# Patient Record
Sex: Female | Born: 1980
Health system: Southern US, Community
[De-identification: ages and names within clinical notes are randomized; demographics above are authoritative.]

---

## 2021-01-08 ENCOUNTER — Other Ambulatory Visit: Payer: Self-pay | Admitting: Physician Assistant

## 2021-01-08 DIAGNOSIS — R928 Other abnormal and inconclusive findings on diagnostic imaging of breast: Secondary | ICD-10-CM

## 2021-01-20 ENCOUNTER — Ambulatory Visit
Admission: RE | Admit: 2021-01-20 | Discharge: 2021-01-20 | Disposition: A | Discharge status: Home / Self Care | Payer: BLUE CROSS/BLUE SHIELD | Source: Ambulatory Visit | Attending: Physician Assistant | Admitting: Physician Assistant

## 2021-01-20 ENCOUNTER — Other Ambulatory Visit: Payer: Self-pay

## 2021-01-20 DIAGNOSIS — R928 Other abnormal and inconclusive findings on diagnostic imaging of breast: Secondary | ICD-10-CM

## 2021-01-21 ENCOUNTER — Other Ambulatory Visit: Payer: Self-pay | Admitting: Physician Assistant

## 2021-01-22 ENCOUNTER — Other Ambulatory Visit: Payer: Self-pay | Admitting: Physician Assistant

## 2021-01-22 DIAGNOSIS — R928 Other abnormal and inconclusive findings on diagnostic imaging of breast: Secondary | ICD-10-CM

## 2021-01-24 ENCOUNTER — Other Ambulatory Visit: Payer: Self-pay | Admitting: Family Medicine

## 2021-01-24 DIAGNOSIS — N649 Disorder of breast, unspecified: Secondary | ICD-10-CM

## 2021-01-29 ENCOUNTER — Other Ambulatory Visit: Payer: Self-pay | Admitting: Family Medicine

## 2021-01-29 DIAGNOSIS — N649 Disorder of breast, unspecified: Secondary | ICD-10-CM

## 2021-02-11 ENCOUNTER — Ambulatory Visit: Payer: BLUE CROSS/BLUE SHIELD | Admitting: General Surgery

## 2021-02-11 ENCOUNTER — Other Ambulatory Visit: Payer: Self-pay

## 2021-02-11 ENCOUNTER — Ambulatory Visit: Payer: Commercial Managed Care - PPO | Admitting: General Surgery

## 2021-02-11 ENCOUNTER — Encounter: Payer: Self-pay | Admitting: General Surgery

## 2021-02-11 VITALS — BP 102/69 | HR 74 | Temp 97.9°F | Resp 12 | Ht 64.0 in | Wt 115.0 lb

## 2021-02-11 DIAGNOSIS — R920 Mammographic microcalcification found on diagnostic imaging of breast: Secondary | ICD-10-CM | POA: Diagnosis not present

## 2021-02-12 NOTE — Progress Notes (Signed)
[place]; [id number]; 09-May-1981   HPI Patient is a 40 year old black female who was referred to my care by Dr. Leandrew Koyanagi for evaluation and treatment of a left breast lesion.  She was found on recent core biopsy of microcalcifications in the left breast to have epithelial atypia.  It was also recommended to undergo a stereotactic core biopsy of the upper, inner quadrant of the right breast.  Patient does not feel a lump.  She has not had nipple discharge.  Her mother was recently diagnosed at the age of 67 with breast cancer. History reviewed. No pertinent past medical history.  History reviewed. No pertinent surgical history.  History reviewed. No pertinent family history.  No current outpatient medications on file prior to visit.   No current facility-administered medications on file prior to visit.    Allergies  Allergen Reactions  . Iodine Anaphylaxis    Social History   Substance and Sexual Activity  Alcohol Use Not Currently   Comment: Occasional    Social History   Tobacco Use  Smoking Status Former Smoker  Smokeless Tobacco Never Used    Review of Systems  Constitutional: Negative.   HENT: Negative.   Eyes: Negative.   Respiratory: Negative.   Cardiovascular: Negative.   Gastrointestinal: Negative.   Genitourinary: Negative.   Musculoskeletal: Negative.   Skin: Negative.   Neurological: Negative.   Endo/Heme/Allergies: Negative.   Psychiatric/Behavioral: Negative.     Objective   Vitals:   02/11/21 1415  BP: 102/69  Pulse: 74  Resp: 12  Temp: 97.9 F (36.6 C)  SpO2: 98%    Physical Exam Vitals reviewed.  Constitutional:      Appearance: Normal appearance. She is normal weight. She is not ill-appearing.  HENT:     Head: Normocephalic and atraumatic.  Cardiovascular:     Rate and Rhythm: Normal rate and regular rhythm.     Heart sounds: Normal heart sounds. No murmur heard. No friction rub. No gallop.   Pulmonary:     Effort:  Pulmonary effort is normal. No respiratory distress.     Breath sounds: Normal breath sounds. No stridor. No wheezing, rhonchi or rales.  Musculoskeletal:     Cervical back: Normal range of motion and neck supple.  Lymphadenopathy:     Cervical: No cervical adenopathy.  Skin:    General: Skin is warm and dry.  Neurological:     Mental Status: She is alert and oriented to person, place, and time.   Breast: No dominant mass, nipple discharge, or dimpling in either breast noted.  Axilla is negative for palpable nodes.  Mammography and pathology reports reviewed  Assessment  Epithelial atypia microcalcifications in left breast Microcalcifications in the right breast Immediate family history of breast cancer Plan   I told the patient that we need to get the stereotactic biopsy of the right breast prior to proceeding with breast biopsy.  [facility]  In addition, I would like to have radiofrequency tag is placed in both lesions.  We will then proceed with left breast biopsy with possible right breast biopsy after this is done.

## 2021-03-25 ENCOUNTER — Telehealth: Payer: Self-pay | Admitting: Family Medicine

## 2021-03-25 NOTE — Telephone Encounter (Signed)
Following up on recommended repeated mammo and biopsy.   Imaging notes - 02/14/21: LM FOR PT TO RESCHEDULE-AJ  02/11/21-2ND ATTEMPT/PT HAVING 2ND OPINION/WCB IF DECIDES TO SCHEDULE-SB 01/22/2021: LEFT MESSAGE FOR PATIENT TO CALL BACK AND SCHEDULE APPOINTMENT - PER DOCTOR BROWN/PROFICIENT/MLG  Called pt and LMOVMTRC.

## 2022-02-15 IMAGING — MG MM BREAST BX W LOC DEV 1ST LESION IMAGE BX SPEC STEREO GUIDE*L*
6 of 12 series · 6 of 40 positions shown · non-contrast
Comparison: Previous exams.
COMPARISON: Previous exams.

Addendum:
CLINICAL DATA: Patient presents for stereotactic guided core biopsy
of LEFT breast calcifications.

EXAM:
LEFT BREAST STEREOTACTIC CORE NEEDLE BIOPSY

[L (1 of 3)]
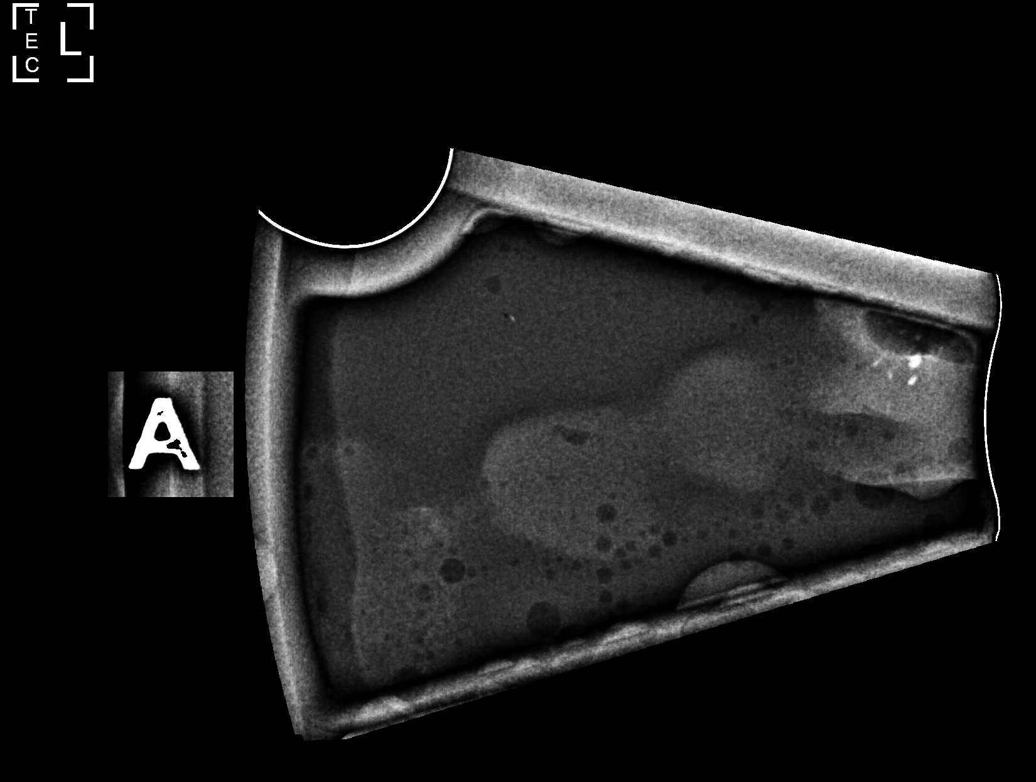

[L (2 of 3)]
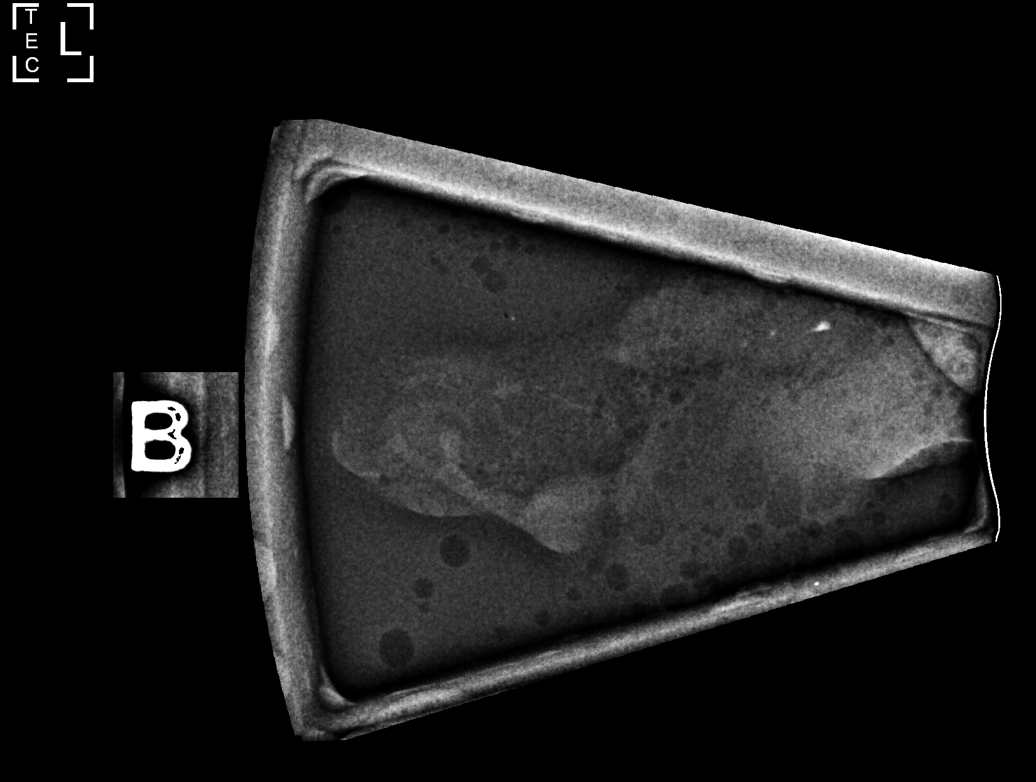

[L (3 of 3)]
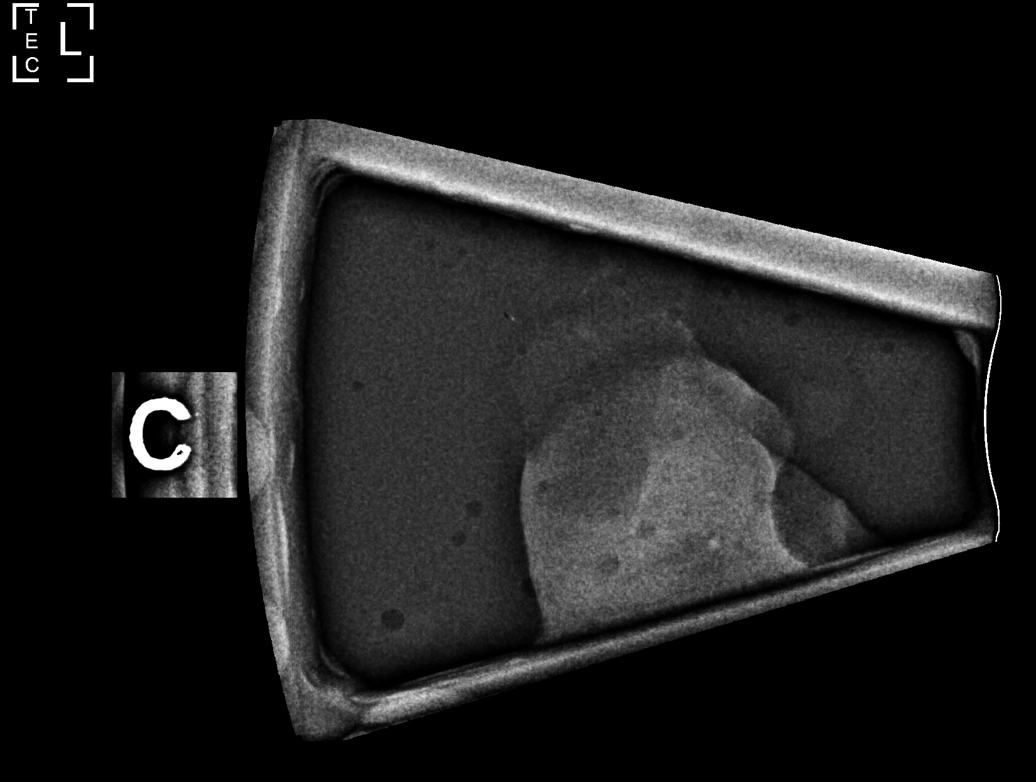

[L LM (1 of 2)]
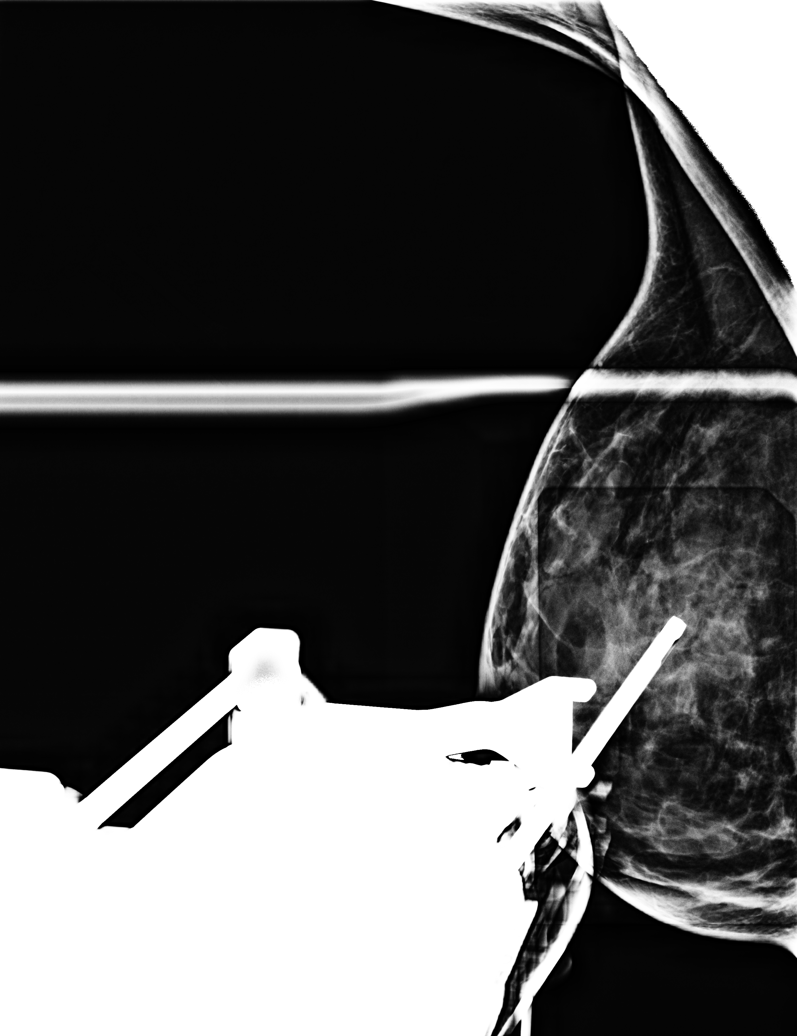

[L LM (2 of 2)]
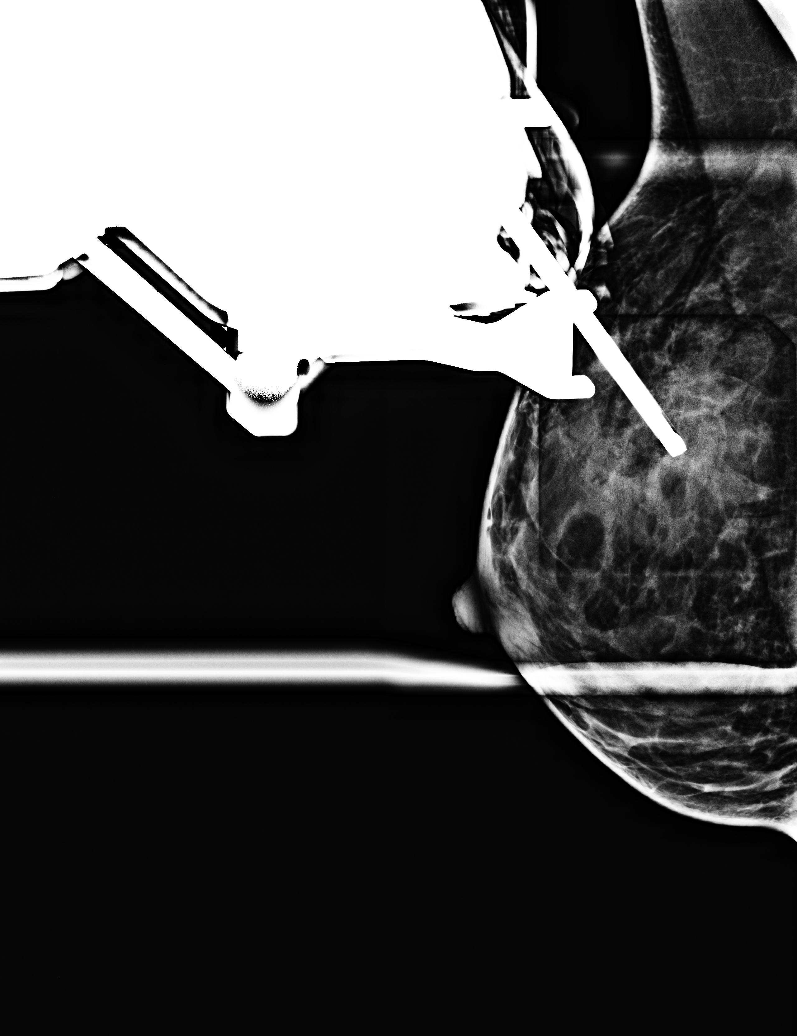

[L CC tomo · tomo slice 21/41.0]
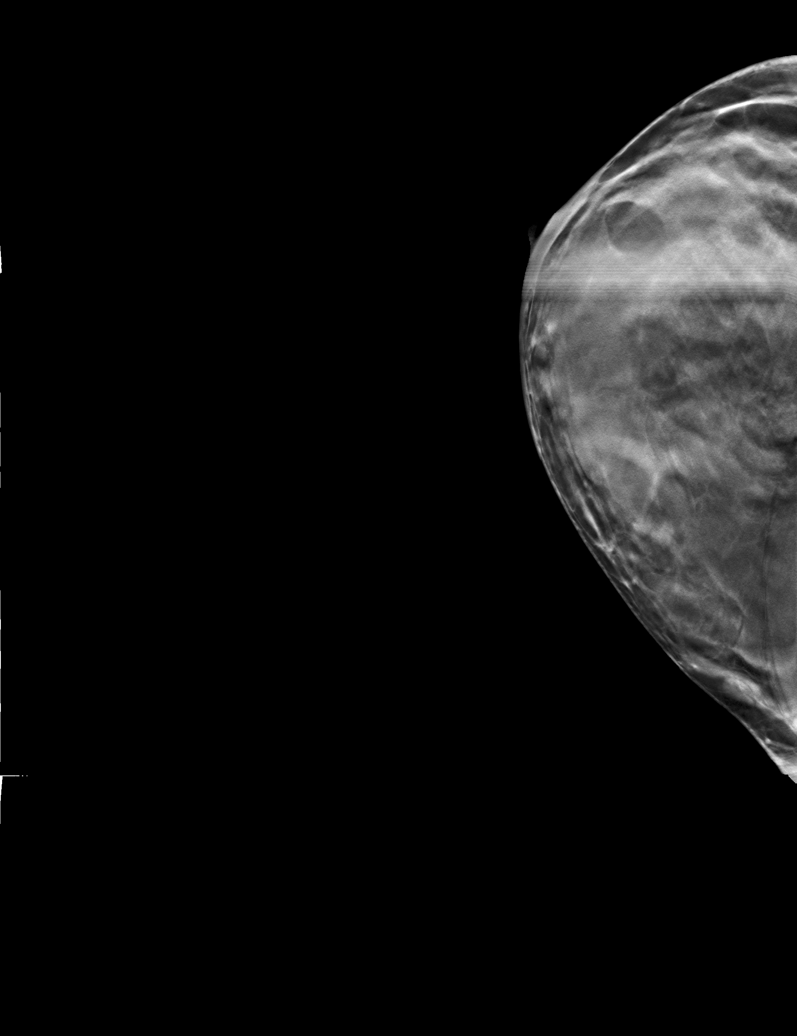

[6 of 40 positions shown; findings below may reference images not displayed]



Using sterile technique and 1% lidocaine and 1% lidocaine with
epinephrine as local anesthetic, under stereotactic guidance, a 9
gauge vacuum assisted device was used to perform core needle biopsy
of calcifications in the UPPER-OUTER QUADRANT of the LEFT breast
using a LATERAL to MEDIAL approach. Specimen radiograph was
performed showing calcifications in numerous tissue samples.
Specimens with calcifications are identified for pathology.

Lesion quadrant: UPPER-OUTER QUADRANT LEFT breast

At the conclusion of the procedure, X shaped tissue marker clip was
deployed into the biopsy cavity. Follow-up 2-view mammogram was
performed and dictated separately.
IMPRESSION: Stereotactic-guided biopsy of LEFT breast calcifications. No
apparent complications.

ADDENDUM:
Pathology revealed FLAT EPITHELIAL ATYPIA WITH CALCIFICATIONS of the
LEFT breast, upper outer quadrant, (x-clip). This was found to be
concordant by Dr. Benrabah Etoil, with surgical consultation for
consideration of excision recommended.

Pathology results were discussed with the patient by telephone. The
patient reported doing well after the biopsy with tenderness and
swelling at the site. Post biopsy instructions and care were
reviewed and questions were answered. The patient was encouraged to
call The [REDACTED] for any additional
concerns. My direct phone number was provided.

Given the results of atypia in the LEFT breast, recommend
stereotactic guided core biopsy of calcifications in the UPPER INNER
QUADRANT of the RIGHT breast. Patient will be contacted regarding
scheduling of this procedure.

Pathology results and recommendations were called to Fanin Herbas,
[facility] arrange a surgical referral at the request of the
patient.

Pathology results were faxed to [REDACTED] on [REDACTED]

Pathology results reported by Pytrik Oei, RN on 01/21/2021.



Using sterile technique and 1% lidocaine and 1% lidocaine with
epinephrine as local anesthetic, under stereotactic guidance, a 9
gauge vacuum assisted device was used to perform core needle biopsy
of calcifications in the UPPER-OUTER QUADRANT of the LEFT breast
using a LATERAL to MEDIAL approach. Specimen radiograph was
performed showing calcifications in numerous tissue samples.
Specimens with calcifications are identified for pathology.

Lesion quadrant: UPPER-OUTER QUADRANT LEFT breast

At the conclusion of the procedure, X shaped tissue marker clip was
deployed into the biopsy cavity. Follow-up 2-view mammogram was
performed and dictated separately.
IMPRESSION: Stereotactic-guided biopsy of LEFT breast calcifications. No
apparent complications.
